# Patient Record
Sex: Female | Born: 1945 | Hispanic: No | Smoking: Never smoker
Health system: Southern US, Community
[De-identification: ages and names within clinical notes are randomized; demographics above are authoritative.]

## PROBLEM LIST (undated history)

## (undated) ENCOUNTER — Emergency Department (HOSPITAL_COMMUNITY): Admission: EM | Payer: Medicare Other | Source: Home / Self Care

## (undated) DIAGNOSIS — I10 Essential (primary) hypertension: Secondary | ICD-10-CM

## (undated) DIAGNOSIS — E785 Hyperlipidemia, unspecified: Secondary | ICD-10-CM

## (undated) HISTORY — DX: Hyperlipidemia, unspecified: E78.5

## (undated) HISTORY — DX: Essential (primary) hypertension: I10

---

## 2011-08-18 ENCOUNTER — Other Ambulatory Visit: Payer: Self-pay | Admitting: Emergency Medicine

## 2012-07-15 ENCOUNTER — Other Ambulatory Visit: Payer: Self-pay | Admitting: Emergency Medicine

## 2012-07-21 ENCOUNTER — Other Ambulatory Visit: Payer: Self-pay | Admitting: Emergency Medicine

## 2013-03-04 ENCOUNTER — Other Ambulatory Visit: Payer: Self-pay | Admitting: Nurse Practitioner

## 2013-03-04 ENCOUNTER — Other Ambulatory Visit: Payer: Self-pay | Admitting: Pediatrics

## 2013-03-04 DIAGNOSIS — Z1231 Encounter for screening mammogram for malignant neoplasm of breast: Secondary | ICD-10-CM

## 2013-03-05 ENCOUNTER — Ambulatory Visit: Payer: Self-pay

## 2013-07-23 ENCOUNTER — Other Ambulatory Visit: Payer: Self-pay | Admitting: Family Medicine

## 2013-07-23 DIAGNOSIS — R911 Solitary pulmonary nodule: Secondary | ICD-10-CM

## 2013-07-29 ENCOUNTER — Ambulatory Visit
Admission: RE | Admit: 2013-07-29 | Discharge: 2013-07-29 | Disposition: A | Discharge status: Home / Self Care | Payer: Medicare Other | Source: Ambulatory Visit | Attending: Family Medicine | Admitting: Family Medicine

## 2013-07-29 DIAGNOSIS — R911 Solitary pulmonary nodule: Secondary | ICD-10-CM

## 2013-08-24 ENCOUNTER — Encounter: Payer: Self-pay | Admitting: Internal Medicine

## 2013-08-24 ENCOUNTER — Encounter (INDEPENDENT_AMBULATORY_CARE_PROVIDER_SITE_OTHER): Payer: Self-pay

## 2013-08-24 ENCOUNTER — Ambulatory Visit (INDEPENDENT_AMBULATORY_CARE_PROVIDER_SITE_OTHER): Payer: Medicare Other | Admitting: Internal Medicine

## 2013-08-24 VITALS — BP 136/80 | HR 72 | Temp 98.3°F | Ht 59.0 in | Wt 146.0 lb

## 2013-08-24 DIAGNOSIS — R918 Other nonspecific abnormal finding of lung field: Secondary | ICD-10-CM

## 2013-08-24 DIAGNOSIS — R059 Cough, unspecified: Secondary | ICD-10-CM

## 2013-08-24 DIAGNOSIS — R058 Other specified cough: Secondary | ICD-10-CM

## 2013-08-24 DIAGNOSIS — R05 Cough: Secondary | ICD-10-CM

## 2013-08-24 NOTE — Patient Instructions (Signed)
Radiology recommends a CT chest no sooner than 3 months and I agree with that recommendation - call when you return from [place] to schedule   The other option is a PET scan which will not likely help define the problem definitively and surgery would be an option, would be definitive but is quite invasive.

## 2013-08-24 NOTE — Progress Notes (Signed)
Subjective:     Patient ID: Barbara Rowe, female   DOB: 11/17/1945 MRN: [id number]  HPI  68 yo Cong[place]inese female never smoker in country x 1995 with onset new cough around 2013 attributed to ? GERD per records referred to pulmonary clinic 08/24/2013 by Dr Hart RochesterHollis for abn ct chest  08/24/2013 1st [facility] office visit/ Arne Schlender  Chief Complaint  Patient presents with  . Pulmonary Consult    Referred per Dr. Julianne HandlerLachina Hollis for eval of pulmonary nodules.  Pt c/o cough x 3 wks. Son reports her cough seems worse at night.   original w/u in 2013 in [place]  because of cough which resolved but was told then about a nodule rec f/u every year.  Cough is actually getting better now  No obvious day to day or daytime variabilty or assoc sob or cp or chest tightness, subjective wheeze overt sinus or hb symptoms. No unusual exp hx or h/o childhood pna/ asthma or knowledge of premature birth.  Sleeping ok without nocturnal  or early am exacerbation  of respiratory  c/o's or need for noct saba. Also denies any obvious fluctuation of symptoms with weather or environmental changes or other aggravating or alleviating factors except as outlined above   Current Medications, Allergies, Complete Past Medical History, Past Surgical History, Family History, and Social History were reviewed in [facility] electronic medical record.  ROS  The following are not active complaints unless bolded sore throat, dysphagia, dental problems, itching, sneezing,  nasal congestion or excess/ purulent secretions, ear ache,   fever, chills, sweats, unintended wt loss, pleuritic or exertional cp, hemoptysis,  orthopnea pnd or leg swelling, presyncope, palpitations, heartburn, abdominal pain, anorexia, nausea, vomiting, diarrhea  or change in bowel or urinary habits, change in stools or urine, dysuria,hematuria,  rash, arthralgias, visual complaints, headache, numbness weakness or ataxia or problems with walking or coordination,  change in  mood/affect or memory.            Review of Systems     Objective:   Physical Exam  amb chinese female nad  Wt Readings from Last 3 Encounters:  08/24/13 146 lb (66.225 kg)       HEENT: nl dentition, turbinates, and orophanx. Nl external ear canals without cough reflex   NECK :  without JVD/Nodes/TM/ nl carotid upstrokes bilaterally   LUNGS: no acc muscle use, clear to A and P bilaterally without cough on insp or exp maneuvers   CV:  RRR  no s3 or murmur or increase in P2, no edema   ABD:  soft and nontender with nl excursion in the supine position. No bruits or organomegaly, bowel sounds nl  MS:  warm without deformities, calf tenderness, cyanosis or clubbing  SKIN: warm and dry without lesions    NEURO:  alert, approp, no deficits     CT chest 07/29/13 1. There is a noncalcified ovoid nodule in superior segment of left  lower lobe measures 1.1 cm x 7.3 cm. Comparison with prior study  would be helpful. If there is no prior study follow-up examination  in 3 months is recommended to assure stability. A pleural based  nodule in right upper lobe laterally measures 3.5 mm. There is a  calcified granuloma in left upper lobe posteriorly measures 5.4 mm.  2. No mediastinal hematoma or adenopathy.  3. Mild degenerative changes thoracic spine.  4. No acute infiltrate or pulmonary edema.       Assessment:

## 2013-08-25 DIAGNOSIS — R058 Other specified cough: Secondary | ICD-10-CM | POA: Insufficient documentation

## 2013-08-25 DIAGNOSIS — R918 Other nonspecific abnormal finding of lung field: Secondary | ICD-10-CM | POA: Insufficient documentation

## 2013-08-25 DIAGNOSIS — R05 Cough: Secondary | ICD-10-CM | POA: Insufficient documentation

## 2013-08-25 NOTE — Assessment & Plan Note (Addendum)
[place]  01/08/11  11 x 8  - Re ct chest 07/29/13 11 x 7  > rec recheck CT 3 m rec  The overall size is not sign changes in 2.5  Years so likely benign with options  1) continue to follow with same scanner serially 2) PET with low sensitivity in a 1 cm diam lesion present x 2.5 y min and won't completely r/o Ca in this setting 3) excisional bx   Discussed in detail all the  indications, usual  risks and alternatives  relative to the benefits with patient who agrees to proceed with repeat CT 3-6 month time frame

## 2013-08-25 NOTE — Assessment & Plan Note (Signed)
Chronic / recurrent and not likely at all to be related to MPNs > rx conservatively unless worsens

## 2014-02-16 ENCOUNTER — Ambulatory Visit: Payer: Self-pay | Admitting: Internal Medicine

## 2014-03-02 ENCOUNTER — Ambulatory Visit: Payer: Self-pay | Admitting: Internal Medicine

## 2014-03-09 ENCOUNTER — Ambulatory Visit: Payer: Self-pay | Admitting: Internal Medicine

## 2014-03-16 ENCOUNTER — Encounter: Payer: Self-pay | Admitting: Internal Medicine

## 2014-03-16 ENCOUNTER — Encounter (INDEPENDENT_AMBULATORY_CARE_PROVIDER_SITE_OTHER): Payer: Self-pay

## 2014-03-16 ENCOUNTER — Ambulatory Visit (INDEPENDENT_AMBULATORY_CARE_PROVIDER_SITE_OTHER): Payer: Medicare Other | Admitting: Internal Medicine

## 2014-03-16 VITALS — BP 112/70 | HR 76 | Ht 60.0 in | Wt 148.0 lb

## 2014-03-16 DIAGNOSIS — R918 Other nonspecific abnormal finding of lung field: Secondary | ICD-10-CM

## 2014-03-16 NOTE — Patient Instructions (Signed)
Please see patient coordinator before you leave today  to schedule CT chest at your convenience
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
## 2014-03-18 NOTE — Assessment & Plan Note (Signed)
[place] [facility] [place] 01/08/11  11 x 8  - Re ct chest 07/29/13 11 x 7   I had an extended discussion with the patient thru interpreter reviewing all relevant studies completed to date and  lasting 15 to 20 minutes of a 25 minute visit on the following ongoing concerns:   Very hard to prove benignancy but very likely that will be the case here  Discussed in detail all the  indications, usual  risks and alternatives  relative to the benefits with patient who agrees to proceed with conservative f/u as outlined  See instructions for specific recommendations which were reviewed directly with the patient who was given a copy with highlighter outlining the key components.

## 2014-03-23 ENCOUNTER — Ambulatory Visit (INDEPENDENT_AMBULATORY_CARE_PROVIDER_SITE_OTHER)
Admission: RE | Admit: 2014-03-23 | Discharge: 2014-03-23 | Disposition: A | Discharge status: Home / Self Care | Payer: Medicare Other | Source: Ambulatory Visit | Attending: Internal Medicine | Admitting: Internal Medicine

## 2014-03-23 DIAGNOSIS — R918 Other nonspecific abnormal finding of lung field: Secondary | ICD-10-CM

## 2014-03-25 NOTE — Progress Notes (Signed)
Quick Note:  ATC, VM Full, WCB ______

## 2014-03-26 NOTE — Progress Notes (Signed)
Quick Note:  Spoke with pt and notified of results per Dr. Wert. Pt verbalized understanding and denied any questions.  ______ 

## 2014-07-22 ENCOUNTER — Other Ambulatory Visit: Payer: Self-pay | Admitting: Internal Medicine

## 2014-07-22 DIAGNOSIS — R918 Other nonspecific abnormal finding of lung field: Secondary | ICD-10-CM

## 2014-09-22 ENCOUNTER — Inpatient Hospital Stay: Admission: RE | Admit: 2014-09-22 | Payer: Self-pay | Source: Ambulatory Visit

## 2014-09-22 ENCOUNTER — Telehealth: Payer: Self-pay | Admitting: Internal Medicine

## 2014-09-22 NOTE — Telephone Encounter (Signed)
Will forward to MW as an FYI 

## 2015-03-04 IMAGING — CT CT CHEST W/O CM
3 of 4 series · 15 of 30 positions shown, 16 images · non-contrast
Comparison: None.

CLINICAL DATA: Left lung nodule

EXAM:
CT CHEST WITHOUT CONTRAST
TECHNIQUE: Multidetector CT imaging of the chest was performed following the
standard protocol without IV contrast.

[Series 3: chest w/o · axial · non-contrast · 0.70mm/px · z∈[-144,-24]mm · 3 of 50 slices shown]
[im 13/50  lung]
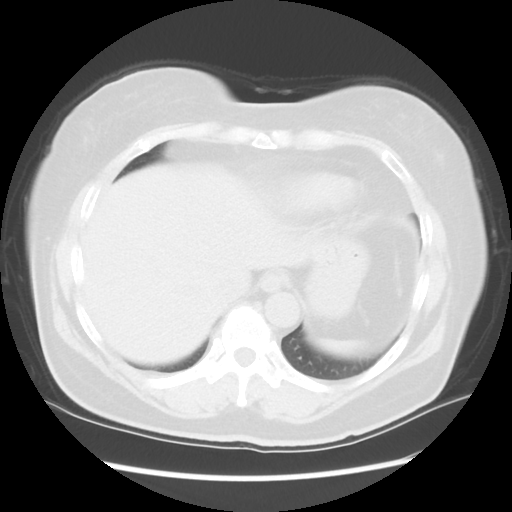
[im 25/50  lung]
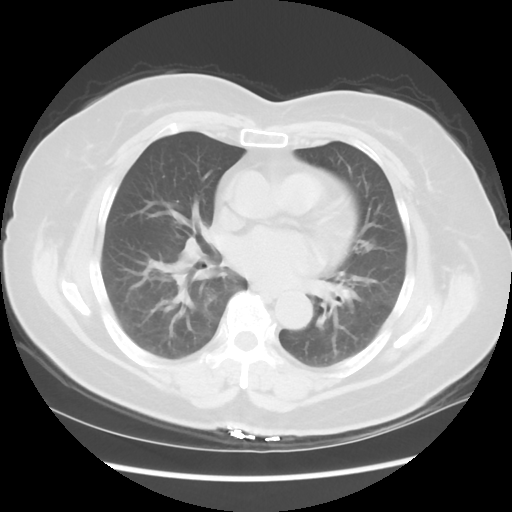
[im 37/50  lung]
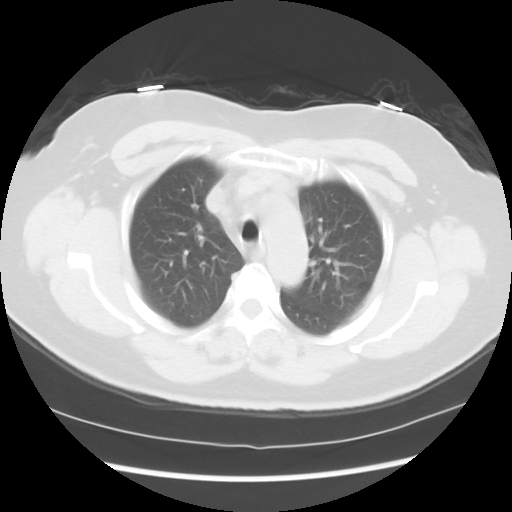

[Series 4: lung windows · axial · 0.70mm/px · z∈[-160,-10]mm · 4 of 50 slices shown, 5 images]
[im 10/50  mediastinal]
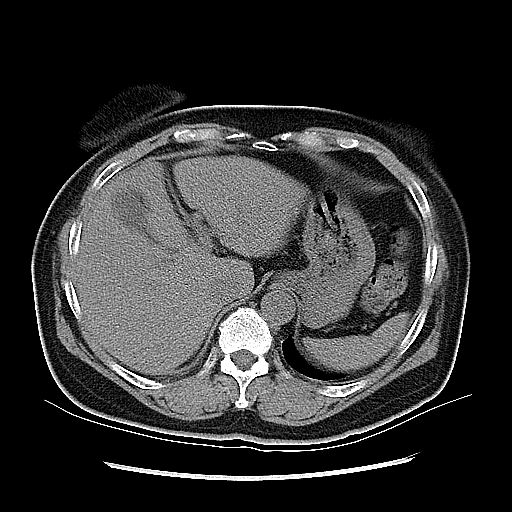
[im 10/50  lung]
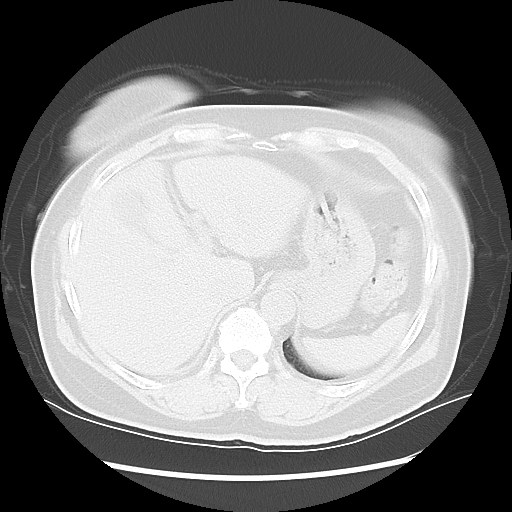
[im 20/50  lung]
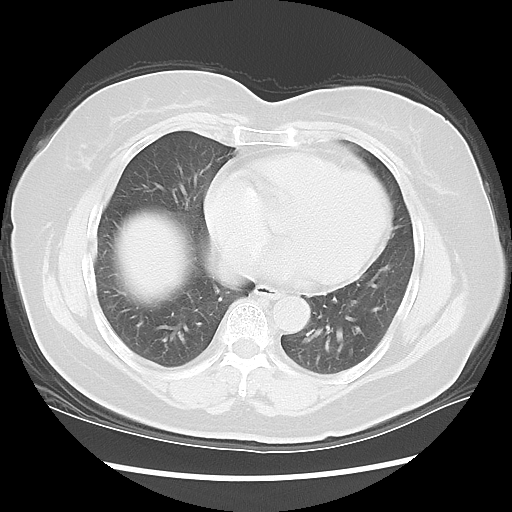
[im 30/50  lung]
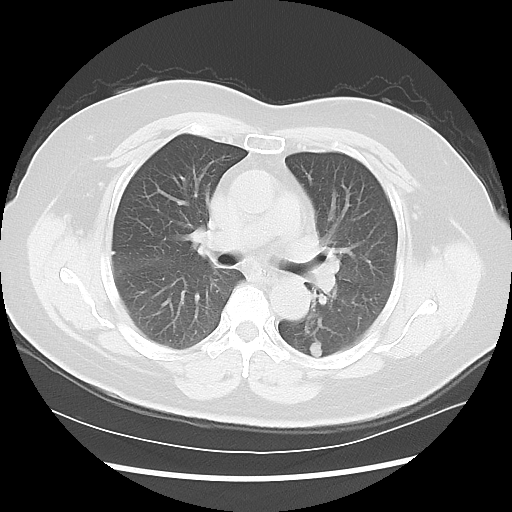
[im 40/50  lung]
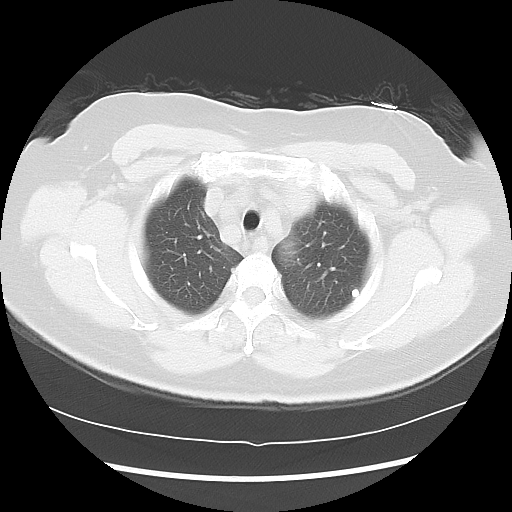

[Series 602: sagittal body · sagittal · 0.70mm/px · 8 of 145 slices shown]
[im 10/145  mediastinal]
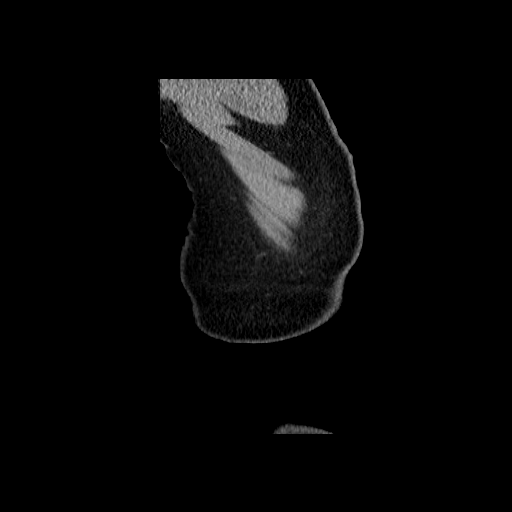
[im 28/145  mediastinal]
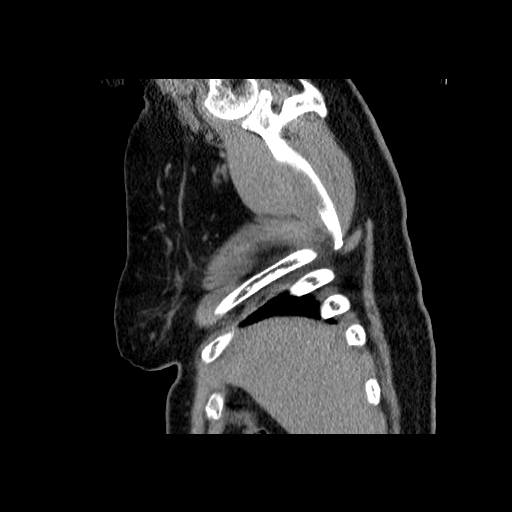
[im 46/145  mediastinal]
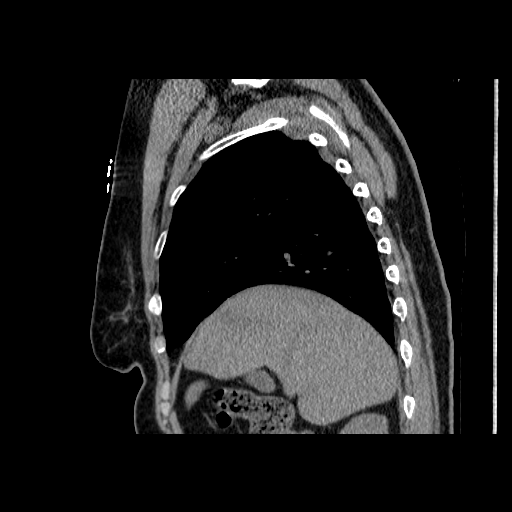
[im 64/145  mediastinal]
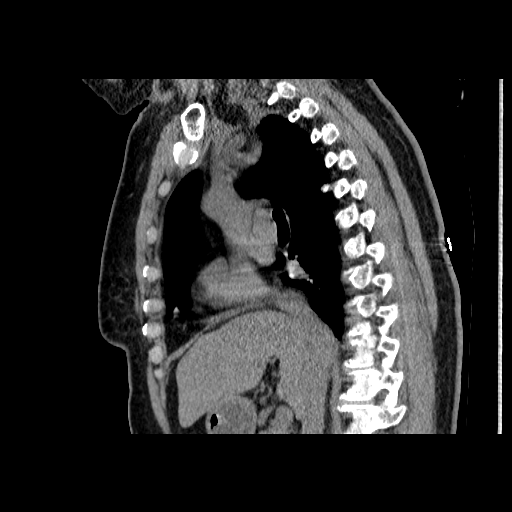
[im 82/145  mediastinal]
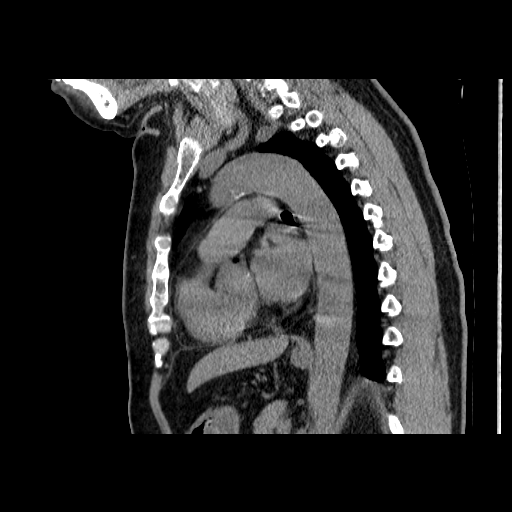
[im 100/145  mediastinal]
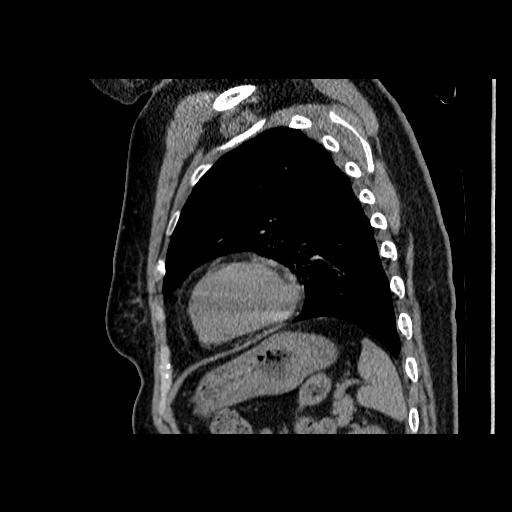
[im 118/145  mediastinal]
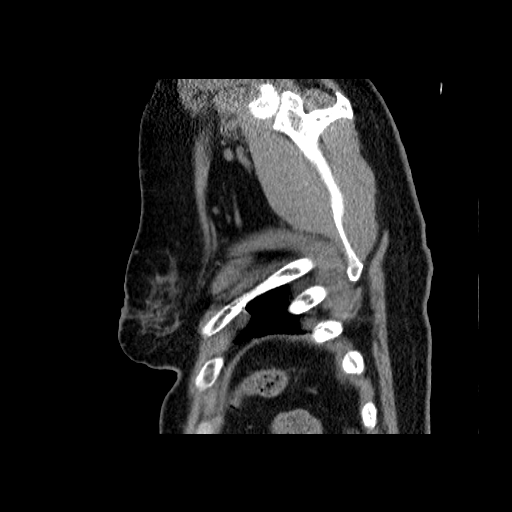
[im 136/145  mediastinal]
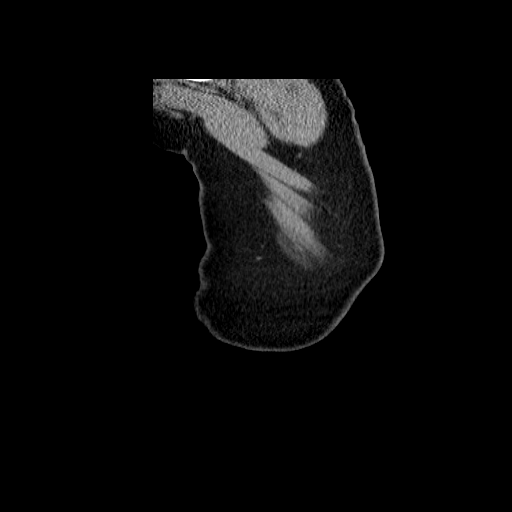

[15 of 30 positions shown; findings below may reference images not displayed]

FINDINGS: Sagittal images of the spine shows mild degenerative changes
thoracic spine. Sagittal view of the sternum is unremarkable.

Images of the thoracic inlet are unremarkable. Central airways are
patent. Mild atherosclerotic calcifications of thoracic aorta. Heart
size within normal limits. No pericardial effusion. No mediastinal
hematoma or adenopathy.

Visualized upper abdomen shows no calcified gallstones. No adrenal
gland mass is noted.

No destructive rib lesions are noted. Images of the lung parenchyma
shows no acute infiltrate or pulmonary edema.

In axial image 11 there is a calcified granuloma in left upper lobe
posteriorly measures 5.4 mm. A pleural based nodule is noted in
right upper lobe laterally measures 3.5 mm.

There is a nodule in right lower lobe superior segment measures
about 1 cm. This nodule is best visualized in sagittal image 91
measures 1.1 by 7.3 cm.
IMPRESSION: 1. There is a noncalcified ovoid nodule in superior segment of left
lower lobe measures 1.1 cm x 7.3 cm. Comparison with prior study
would be helpful. If there is no prior study follow-up examination
in 3 months is recommended to assure stability. A pleural based
nodule in right upper lobe laterally measures 3.5 mm. There is a
calcified granuloma in left upper lobe posteriorly measures 5.4 mm.
2. No mediastinal hematoma or adenopathy.
3. Mild degenerative changes thoracic spine.
4. No acute infiltrate or pulmonary edema.

## 2015-08-09 ENCOUNTER — Other Ambulatory Visit: Payer: Self-pay | Admitting: Family Medicine

## 2015-08-09 DIAGNOSIS — R911 Solitary pulmonary nodule: Secondary | ICD-10-CM

## 2015-08-12 ENCOUNTER — Inpatient Hospital Stay: Admission: RE | Admit: 2015-08-12 | Payer: Self-pay | Source: Ambulatory Visit

## 2015-09-06 ENCOUNTER — Encounter: Payer: Self-pay | Admitting: Internal Medicine

## 2015-09-06 ENCOUNTER — Ambulatory Visit (INDEPENDENT_AMBULATORY_CARE_PROVIDER_SITE_OTHER)
Admission: RE | Admit: 2015-09-06 | Discharge: 2015-09-06 | Disposition: A | Discharge status: Home / Self Care | Payer: Medicare Other | Source: Ambulatory Visit | Attending: Internal Medicine | Admitting: Internal Medicine

## 2015-09-06 ENCOUNTER — Ambulatory Visit (INDEPENDENT_AMBULATORY_CARE_PROVIDER_SITE_OTHER): Payer: Medicare Other | Admitting: Internal Medicine

## 2015-09-06 VITALS — BP 110/78 | HR 67 | Ht 60.0 in | Wt 140.0 lb

## 2015-09-06 DIAGNOSIS — R918 Other nonspecific abnormal finding of lung field: Secondary | ICD-10-CM | POA: Diagnosis not present
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
## 2015-09-06 NOTE — Patient Instructions (Signed)
Please remember to go to the  x-ray department downstairs for your tests - we will call you with the results when they are available.     

## 2015-09-06 NOTE — Progress Notes (Signed)
Quick Note:  Spoke with Barbara FlavorsQi Chen, pt's son and notified of results He verbalized understanding and will inform the pt ______

## 2015-09-08 NOTE — Assessment & Plan Note (Signed)
Originally detected [place] [facility] [place] 01/08/11  11 x 8  - Re ct chest 07/29/13 11 x 7  - RE CT  03/24/15 > Stable calcified and noncalcified pulmonary nodules bilaterally. The dominant noncalcified lesion in the superior segment of the left lower lobe measures 10 mm in diameter and requires additional follow-up CT in 6 months. - 09/22/2014 called to scheduled/ declined to reschedule and moved - since likely benign in never smoker will not force the issue/ f/u prn  - cxr 09/06/2015 > none viz    I had an extended final summary discussion with the patient and son  reviewing all relevant studies completed to date and  lasting 15 to 20 minutes of a 25 minute visit on the following issues:   At this point p 5 years these are all likely benign and no f/u needed in this never smoker as long as no resp symptoms, pulmonary f/u can be prn

## 2016-07-31 ENCOUNTER — Encounter (HOSPITAL_COMMUNITY): Payer: Self-pay

## 2016-07-31 ENCOUNTER — Emergency Department (HOSPITAL_COMMUNITY)
Admission: EM | Admit: 2016-07-31 | Discharge: 2016-07-31 | Disposition: A | Discharge status: Home / Self Care | Payer: Medicare Other | Attending: Emergency Medicine | Admitting: Emergency Medicine

## 2016-07-31 ENCOUNTER — Emergency Department (HOSPITAL_BASED_OUTPATIENT_CLINIC_OR_DEPARTMENT_OTHER): Payer: Medicare Other

## 2016-07-31 DIAGNOSIS — E876 Hypokalemia: Secondary | ICD-10-CM | POA: Diagnosis not present

## 2016-07-31 DIAGNOSIS — M79605 Pain in left leg: Secondary | ICD-10-CM | POA: Diagnosis present

## 2016-07-31 DIAGNOSIS — I10 Essential (primary) hypertension: Secondary | ICD-10-CM | POA: Insufficient documentation

## 2016-07-31 DIAGNOSIS — M79609 Pain in unspecified limb: Secondary | ICD-10-CM | POA: Diagnosis not present

## 2016-07-31 LAB — BASIC METABOLIC PANEL
Anion gap: 9 (ref 5–15)
BUN: 7 mg/dL (ref 6–20)
CALCIUM: 8.8 mg/dL — AB (ref 8.9–10.3)
CO2: 27 mmol/L (ref 22–32)
CREATININE: 0.79 mg/dL (ref 0.44–1.00)
Chloride: 104 mmol/L (ref 101–111)
GFR calc Af Amer: >60 mL/min (ref >60)
GFR calc non Af Amer: >60 mL/min (ref >60)
Glucose, Bld: 191 mg/dL — ABNORMAL HIGH (ref 65–99)
Potassium: 3.1 mmol/L — ABNORMAL LOW (ref 3.5–5.1)
Sodium: 140 mmol/L (ref 135–145)

## 2016-07-31 LAB — CBC WITH DIFFERENTIAL/PLATELET
BASOS PCT: 1 %
Basophils Absolute: 0 10*3/uL (ref 0.0–0.1)
EOS PCT: 2 %
Eosinophils Absolute: 0.1 10*3/uL (ref 0.0–0.7)
HCT: 39 % (ref 36.0–46.0)
Hemoglobin: 12.9 g/dL (ref 12.0–15.0)
Lymphocytes Relative: 38 %
Lymphs Abs: 1.6 10*3/uL (ref 0.7–4.0)
MCH: 29.9 pg (ref 26.0–34.0)
MCHC: 33.1 g/dL (ref 30.0–36.0)
MCV: 90.5 fL (ref 78.0–100.0)
MONO ABS: 0.4 10*3/uL (ref 0.1–1.0)
MONOS PCT: 9 %
Neutro Abs: 2.1 10*3/uL (ref 1.7–7.7)
Neutrophils Relative %: 50 %
Platelets: 181 10*3/uL (ref 150–400)
RBC: 4.31 MIL/uL (ref 3.87–5.11)
RDW: 12.6 % (ref 11.5–15.5)
WBC: 4.2 10*3/uL (ref 4.0–10.5)

## 2016-07-31 NOTE — Progress Notes (Signed)
Preliminary results by tech - Venous Duplex Left Lower Ext. Completed. Negative for deep and superficial vein thrombosis and Baker's Cyst.  Marilynne Halsted, BS, RDMS, RVT

## 2016-07-31 NOTE — ED Notes (Signed)
Pt did not answer when called x3 

## 2016-07-31 NOTE — ED Provider Notes (Signed)
[facility]-EMERGENCY DEPT Provider Note   CSN: [id number] Arrival date & time: 07/31/16  1326   By signing my name below, I, [name], attest that this documentation has been prepared under the direction and in the presence of [name], MD  Electronically Signed: [name], ED Scribe. 07/31/16. 4:38 PM.   History   Chief Complaint Chief Complaint  Patient presents with  . Leg Pain    HPI Comments:  Barbara Rowe is a 71 y.o. female who presents to the Emergency Department complaining of ongoing, gradually improving, mild left lower extremity pain which began over 20 days ago. Daughter-in-law reports associated tingling and states the pt has difficulty ambulating. Movement makes her pain worse. Pt has been taking prescribed pain medication with relief. Pt has also been massaging her extremity with relief. Pt denies any recent injuries/falls, back pain or any other associated symptoms. No other complaints noted.   History translated by daughter-in-law   The history is provided by the patient and a relative. No language interpreter was used.  Leg Pain   This is a new problem. The current episode started more than 1 week ago. The problem occurs constantly. The problem has been gradually improving. The pain is present in the left lower leg and left upper leg. The pain is mild. Associated symptoms include tingling. Pertinent negatives include no numbness. Treatments tried: prescription pain medications. The treatment provided mild relief. There has been no history of extremity trauma.    Past Medical History:  Diagnosis Date  . Hyperlipidemia   . Hypertension     Patient Active Problem List   Diagnosis Date Noted  . Multiple pulmonary nodules 08/25/2013  . Upper airway cough syndrome 08/25/2013    History reviewed. No pertinent surgical history.  OB History    No data available       Home Medications    Prior to Admission medications   Medication Sig Start Date  End Date Taking? Authorizing Provider  amLODipine (NORVASC) 10 MG tablet Take 10 mg by mouth daily.    Historical Provider, MD  atorvastatin (LIPITOR) 40 MG tablet Take 40 mg by mouth daily.    Historical Provider, MD    Family History No family history on file.  Social History Social History  Substance Use Topics  . Smoking status: Never Smoker  . Smokeless tobacco: Never Used  . Alcohol use No     Allergies   Patient has no known allergies.   Review of Systems Review of Systems  Neurological: Positive for tingling. Negative for numbness.  All other systems reviewed and are negative for acute change except as noted in the HPI.   Physical Exam Updated Vital Signs BP (!) 144/77 (BP Location: Left Arm)   Pulse 67   Temp 97.9 F (36.6 C) (Oral)   Resp 14   Ht  (1.549 m)   Wt 146 lb (66.2 kg)   SpO2 99%   BMI 27.59 kg/m   Physical Exam  Constitutional: She is oriented to person, place, and time. She appears well-developed and well-nourished. No distress.  HENT:  Head: Normocephalic and atraumatic.  Nose: Nose normal.  Eyes: Conjunctivae and EOM are normal. Pupils are equal, round, and reactive to light. Right eye exhibits no discharge. Left eye exhibits no discharge. No scleral icterus.  Neck: Normal range of motion. Neck supple.  Cardiovascular: Normal rate and regular rhythm.  Exam reveals no gallop and no friction rub.   No murmur heard. Pulmonary/Chest: Effort  normal and breath sounds normal. No stridor. No respiratory distress. She has no rales.  Abdominal: Soft. She exhibits no distension. There is no tenderness.  Musculoskeletal: She exhibits no edema.       Left knee: She exhibits no swelling and no effusion. No tenderness found.       Lumbar back: She exhibits no tenderness and no bony tenderness.       Left upper leg: She exhibits tenderness. She exhibits no bony tenderness, no swelling, no edema and no deformity.       Left lower leg: She exhibits  tenderness. She exhibits no bony tenderness, no swelling, no edema and no deformity.       Legs: Neurological: She is alert and oriented to person, place, and time.  Skin: Skin is warm and dry. No rash noted. She is not diaphoretic. No erythema.  Psychiatric: She has a normal mood and affect.  Vitals reviewed.    ED Treatments / Results  DIAGNOSTIC STUDIES:  Oxygen Saturation is 99% on RA, normal by my interpretation.    COORDINATION OF CARE:  4:37 PM Discussed treatment plan with pt at bedside and pt agreed to plan.  Labs (all labs ordered are listed, but only abnormal results are displayed) Labs Reviewed  BASIC METABOLIC PANEL - Abnormal; Notable for the following:       Result Value   Potassium 3.1 (*)    Glucose, Bld 191 (*)    Calcium 8.8 (*)    All other components within normal limits  CBC WITH DIFFERENTIAL/PLATELET    EKG  EKG Interpretation None       Radiology  Preliminary results by tech - Venous Duplex Left Lower Ext. Completed. Negative for deep and superficial vein thrombosis and Baker's Cyst.  Luck Sink Sturdivant, BS, RDMS, RVT  Procedures Procedures (including critical care time)  Medications Ordered in ED Medications - No data to display   Initial Impression / Assessment and Plan / ED Course  I have reviewed the triage vital signs and the nursing notes.  Pertinent labs & imaging results that were available during my care of the patient were reviewed by me and considered in my medical decision making (see chart for details).     Ultrasound negative for DVT. Presentation most consistent with MSK pain. Patient denied any trauma thus doubt Bony etiology. Screening labs obtained and did reveal mild hypokalemia which may be resulting in muscle cramps. Patient denied any back pain that would be concerning for radiculopathy. No evidence to suggest septic arthritis. No evidence of cellulitis.  The patient is safe for discharge with strict return  precautions.     Final Clinical Impressions(s) / ED Diagnoses   Final diagnoses:  Pain of left lower extremity  Hypokalemia   Disposition: Discharge  Condition: Good  I have discussed the results, Dx and Tx plan with the patient who expressed understanding and agree(s) with the plan. Discharge instructions discussed at great length. The patient was given strict return precautions who verbalized understanding of the instructions. No further questions at time of discharge.    New Prescriptions   No medications on file    Follow Up: Massie Maroon, FNP [address] [place] [address] [phone]  Schedule an appointment as soon as possible for a visit  in 5-7 days, If symptoms do not improve or  worsen   I personally performed the services described in this documentation, which was scribed in my presence. The recorded information has  been reviewed and is accurate.        [name], MD 07/31/16 [phone]

## 2016-07-31 NOTE — ED Triage Notes (Addendum)
Patient sent by Dr. Knox Royalty for left leg pain x 3 weeks. Pain worse with ambulation and had positive d-dimer. denies trauma. Reports swelling to same. No shortness of breath, NAD

## 2017-04-11 IMAGING — DX DG CHEST 2V
2 series · 2 of 2 positions shown · non-contrast
Comparison: CT chest of 03/23/2014

CLINICAL DATA: History of lung nodules, followup

EXAM:
CHEST  2 VIEW

[chest pa]
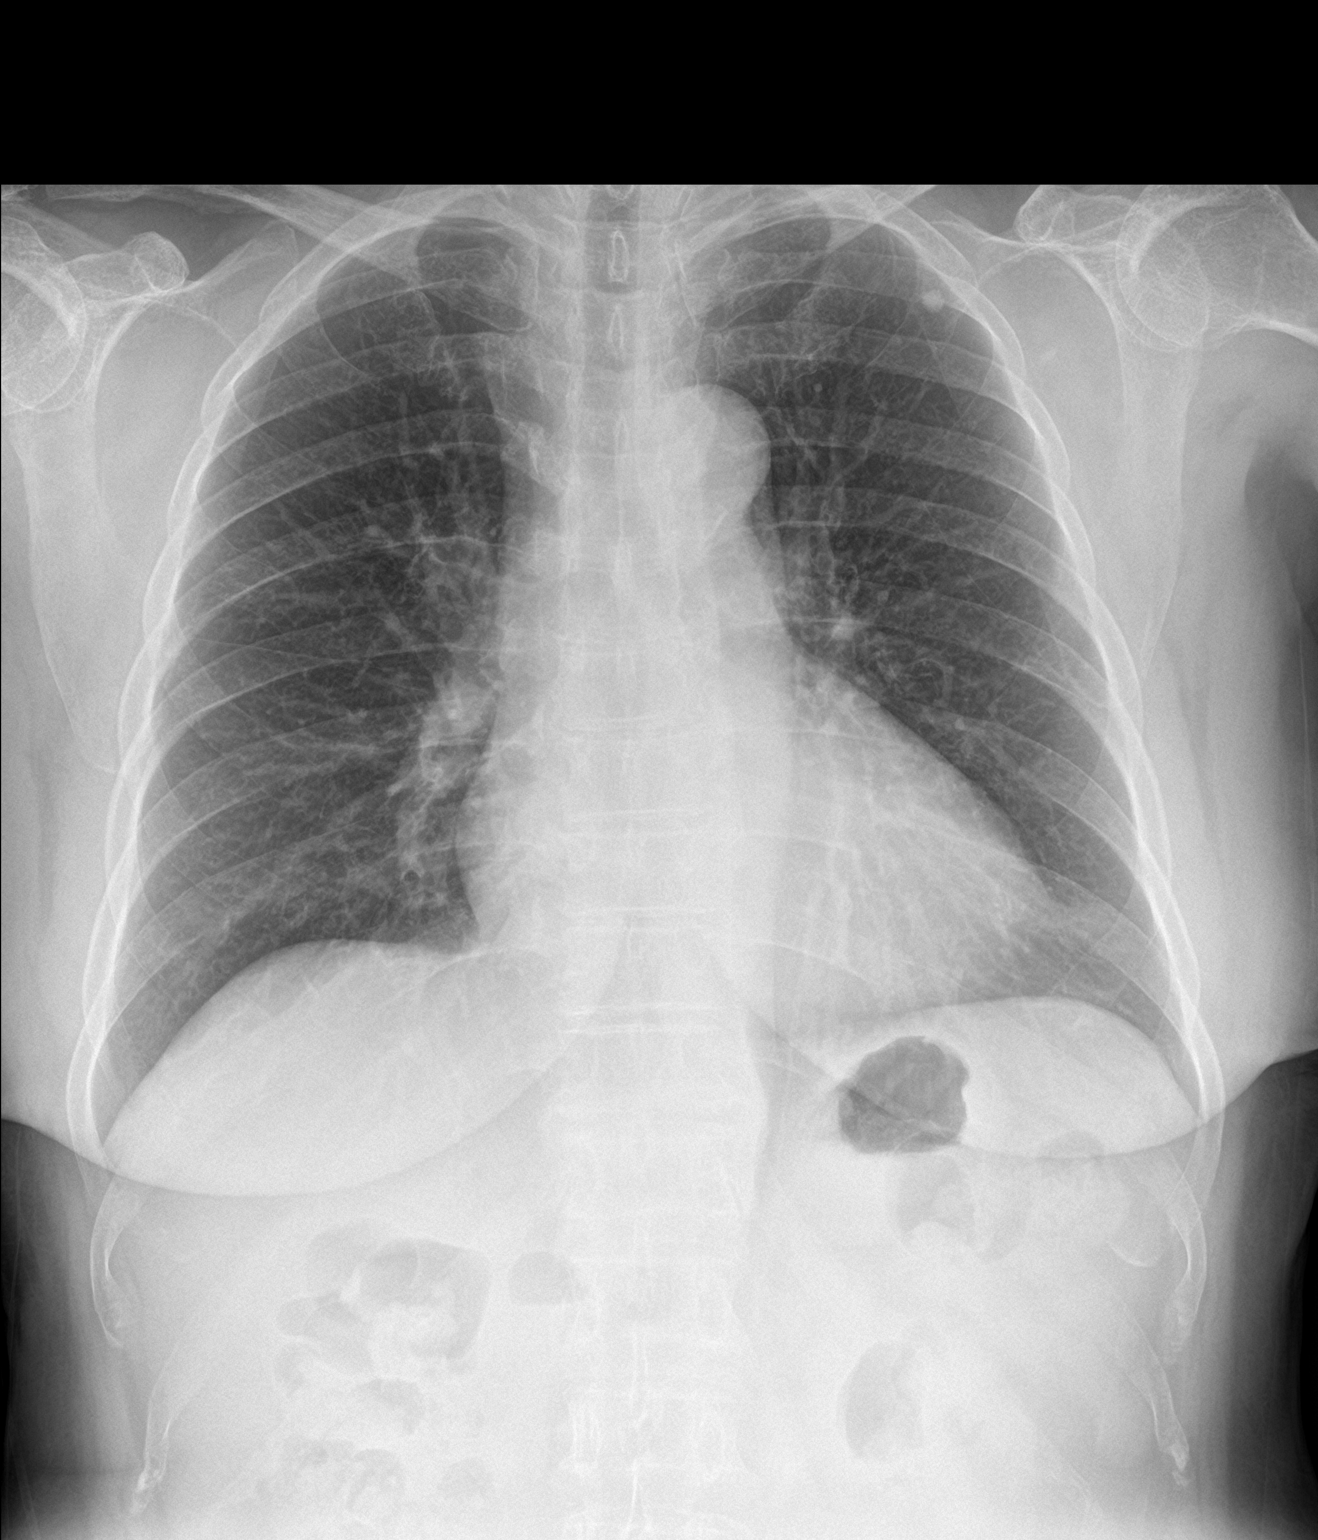

[chest lat]
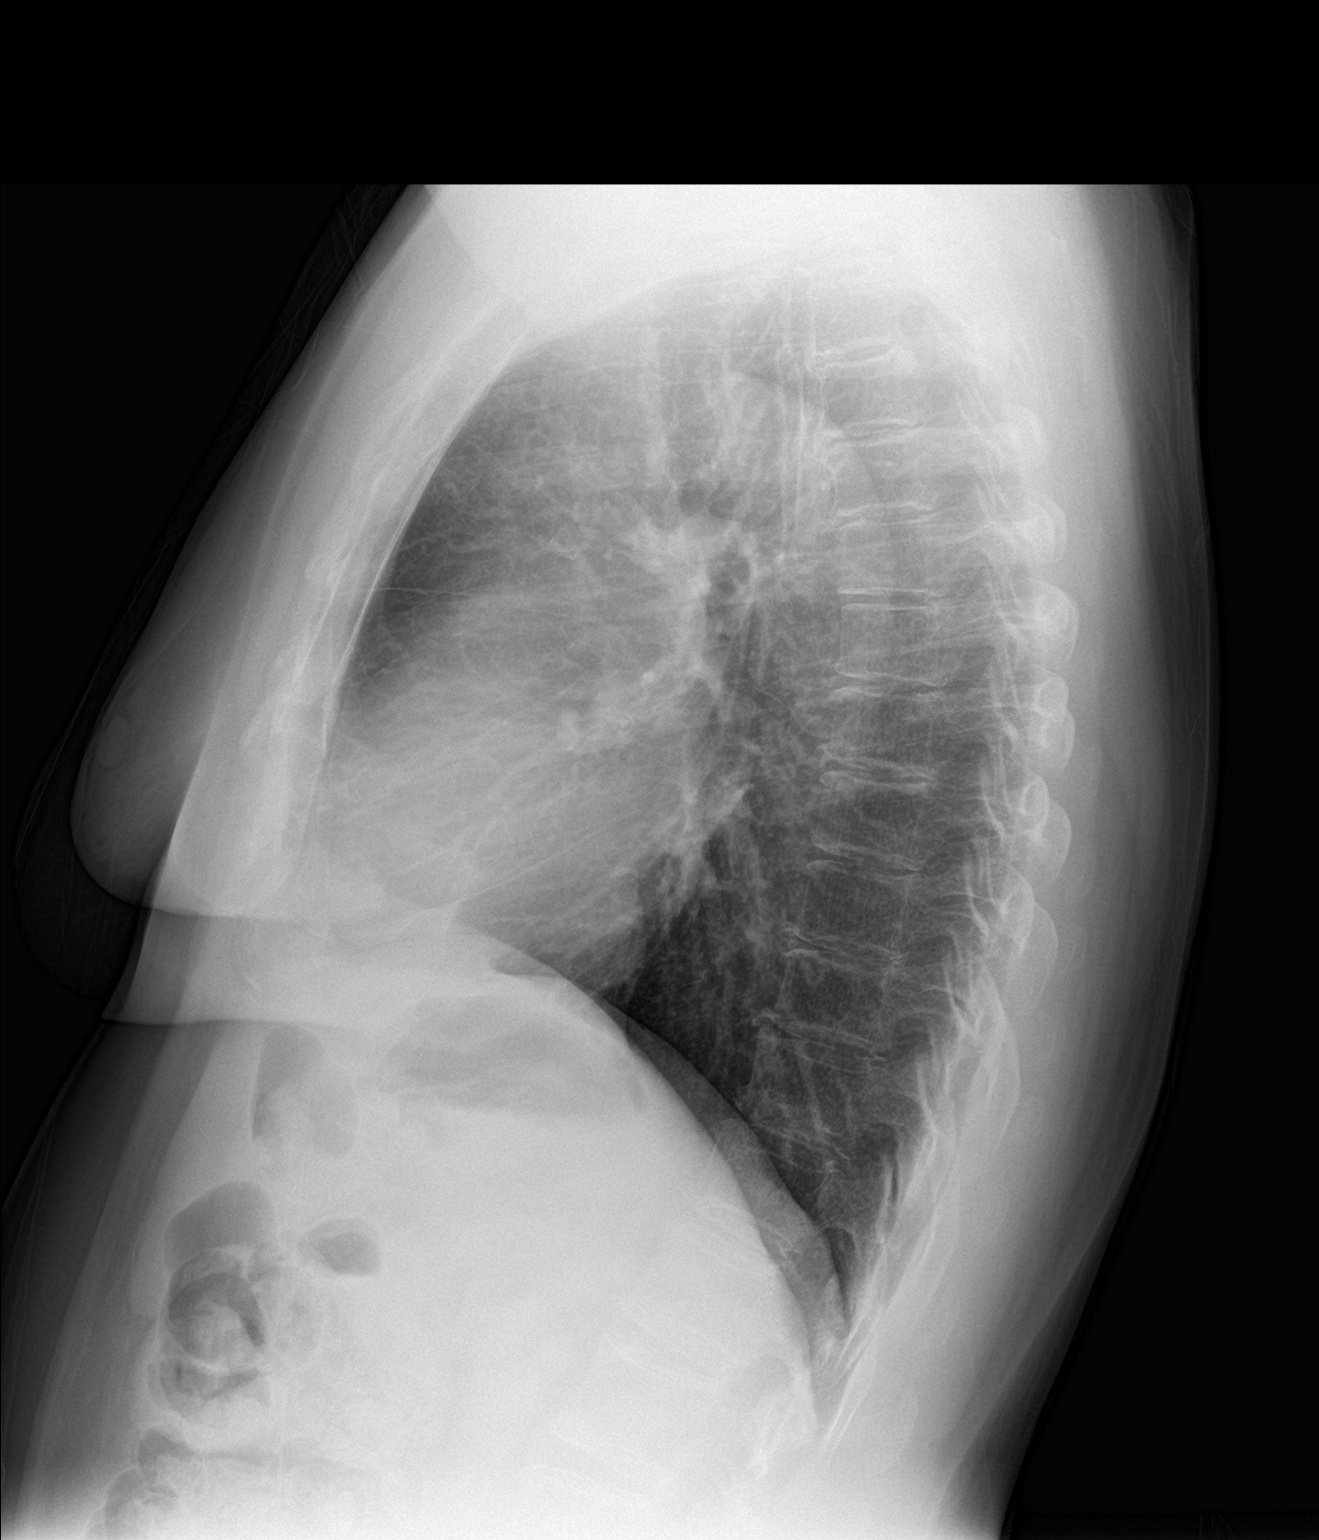

[2 of 2 positions shown; findings below may reference images not displayed]

FINDINGS: A calcified granuloma is noted within the left upper lobe near the
apex. However the dominant nodule described previously within the
superior segment of the left lower lobe is not definitely seen. This
nodule was somewhat medially and posteriorly positioned and could be
obscured by overlying bony and vascular structures. Therefore, CT of
the chest is recommended to more sensitively assess resolution.
Mediastinal and hilar contours are unremarkable. The heart is within
normal limits in size. No bony abnormality is seen.
IMPRESSION: 1. No definite active process. Stable calcified granuloma in the
left upper lobe.
2. The dominant nodule described by prior CT is not definitely
visible but CT of the chest is recommended as noted above.
